# Patient Record
Sex: Female | Born: 1985 | Marital: Married | Smoking: Never smoker
Health system: Southern US, Community
[De-identification: ages and names within clinical notes are randomized; demographics above are authoritative.]

## PROBLEM LIST (undated history)

## (undated) DIAGNOSIS — T7840XA Allergy, unspecified, initial encounter: Secondary | ICD-10-CM

## (undated) HISTORY — DX: Allergy, unspecified, initial encounter: T78.40XA

---

## 2013-02-27 ENCOUNTER — Encounter: Payer: Self-pay | Admitting: Physician Assistant

## 2013-02-27 ENCOUNTER — Ambulatory Visit (INDEPENDENT_AMBULATORY_CARE_PROVIDER_SITE_OTHER): Payer: 59

## 2013-02-27 ENCOUNTER — Ambulatory Visit (INDEPENDENT_AMBULATORY_CARE_PROVIDER_SITE_OTHER): Payer: 59 | Admitting: Physician Assistant

## 2013-02-27 VITALS — BP 112/73 | HR 80 | Ht 65.0 in | Wt 159.0 lb

## 2013-02-27 DIAGNOSIS — Z111 Encounter for screening for respiratory tuberculosis: Secondary | ICD-10-CM

## 2013-02-27 DIAGNOSIS — Z0289 Encounter for other administrative examinations: Secondary | ICD-10-CM

## 2013-02-27 DIAGNOSIS — J069 Acute upper respiratory infection, unspecified: Secondary | ICD-10-CM

## 2013-02-27 DIAGNOSIS — Z0000 Encounter for general adult medical examination without abnormal findings: Secondary | ICD-10-CM

## 2013-02-27 MED ORDER — FLUTICASONE PROPIONATE 50 MCG/ACT NA SUSP: 2.0000 | Freq: Every day | NASAL | Status: AC

## 2013-02-27 NOTE — Patient Instructions (Addendum)

## 2013-02-27 NOTE — Progress Notes (Signed)
  Subjective:    Patient ID: Wendy Tran, female    DOB: August 14, 1985, 27 y.o.   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 Pt is also a little congested today with some PND. Started this morning. Not tried anything to make better. NO fever, chills, n/v/d, SOB, cough, wheezing.   Pt does not smoke or drink alcohol. Pt works out 3-5 days a week.   Pt has had fasting labs drawn and will be sending with records. She feels like she has all her immunizations for school. Will send over and fill out paperwork.     Review of Systems  All other systems reviewed and are negative.       Objective:   Physical Exam  Constitutional: She is oriented to person, place, and time. She appears well-developed and well-nourished.  HENT:  Head: Normocephalic and atraumatic.  TM's bilateral buldging with no erythema, blood or pus.  Negative for maxillary or frontal tenderness to palpation.  Turbinates red and swollen bilaterally.   Oropharynx red with PND. No enlarged tonsils or exudate.  Eyes: Conjunctivae are normal. Right eye exhibits no discharge. Left eye exhibits no discharge.  Neck: Normal range of motion. Neck supple.  Cardiovascular: Normal rate, regular rhythm and normal heart sounds.   Pulmonary/Chest: Effort normal and breath sounds normal.  Lymphadenopathy:    She has no cervical adenopathy.  Neurological: She is alert and oriented to person, place, and time.  Skin: Skin is warm and dry.  Psychiatric: She has a normal mood and affect. Her behavior is normal.          Assessment & Plan:  Routine screening- Filled out paperwork for school. Waiting on immunization report to finish paperwork. Pt declined Hep B vaccine. Pt gets flu shot free at work. Will get in October.   URI- discussed decongestant for a couple of days maybe with mucinex D. Vtiamin C and Zinc could also help.  Since nasal spray to help with nasal congestion and maybe help drain ears. Flonase sent to pharmacy.   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 No symptoms of TB will get Chest-Xray.

## 2013-04-27 ENCOUNTER — Other Ambulatory Visit: Payer: Self-pay

## 2013-09-01 ENCOUNTER — Ambulatory Visit (INDEPENDENT_AMBULATORY_CARE_PROVIDER_SITE_OTHER): Payer: 59 | Admitting: Psychiatry

## 2013-09-01 ENCOUNTER — Encounter (HOSPITAL_COMMUNITY): Payer: Self-pay | Admitting: Psychiatry

## 2013-09-01 DIAGNOSIS — F329 Major depressive disorder, single episode, unspecified: Secondary | ICD-10-CM

## 2013-09-01 DIAGNOSIS — F321 Major depressive disorder, single episode, moderate: Secondary | ICD-10-CM

## 2013-09-01 NOTE — Progress Notes (Signed)
  [facility] Biopsychosocial Assessment  Wendy BrilliantChristie Tran 27 y.o. 09/01/2013   Referred by: Self  PRESENTING PROBLEM Chief Complaint:  Pt is a 28 year old, separated, employed, Caucasian, female self-referred for counseling for the treatment of depression following the recent separation from her husband. Pt states her primary stressor is 1) Husband. Pt said she and her husband were living in [place] and decided to relocate to [place] last year. Pt said they got married after relocating. Pt said they bought a house together in [place] and got two puppies together and then her husband decided he no longer wanted to be married and moved back up to [place] and is living with his parents. Pt said she feels all alone in a new state without any support, is trying to figure out of to sell their home that her and her husband own jointly and trying to adjust to the loss of her husband and their marriage. Pt was tearful as she recalled the past years events leading up to the separation. Pt said she was blindsided by him leaving, but said he has a history of impulsive behavior and he has separated from her before they got married for a short period of time. Pt said her mother came down from [place] and has been staying with Pt after the separation. Pt describes her as a support. Pt is scheduled to move back to [place] April 5th. Pt denies any other past mental health history or current stressors. Pt said she wants counseling to help her with the adjustment from the separation from her husband.   What are the main stressors in your life right now, how long?  Depression and Seperation from husband.  PREVIOUS MENTAL  HEALTH SERVICES Pt Denies  MEDICAL HISTORY Pt Denies  SOCIAL/FAMILY HISTORY Pt currently lives alone in [place].  EDUCATION College Degree   EMPLOYMENT(financial issues) Pt works full time on third shift at [employer] [place]  LEGAL HISTORY Pt Denies  SUBSTANCE USE Pt Denies  MENTAL STATUS General Appearance Luretha Murphy/Behavior:  Neat, well groomed Eye Contact: Good Motor Behavior: Good Speech: Normal rate, volume, tone Level of Consciousness:  WNL Mood: Depressed Affect: wide range of affect Anxiety Level: minimal Thought Process: Clear, coherent, goal directed  Thought Content: WNL Perception: Good Judgment: Good Insight: Good Cognition: WNL  DIAGNOSIS  AXIS I   Depression Major Single Episode Moderate   PLAN Oriented Pt to the therapeutic process and discussed therapeutic expectations. Provided Pt with crisis information including; procedures to manage a mental health emergency, daytime crisis phone numbers and after hour's crisis contact information.  Pt agrees to weekly counseling to address symptoms of depression from the separation from her husband.    Carman ChingGLEHORN,[place] E, LCSW 09/01/2013

## 2014-02-23 IMAGING — CR DG CHEST 2V
2 series · 2 of 2 positions shown · non-contrast
Comparison: None.

CLINICAL DATA: Annual physical exam.  Chronically positive PPD
test.

CHEST - 2 VIEW

[view not recorded (1 of 2)]
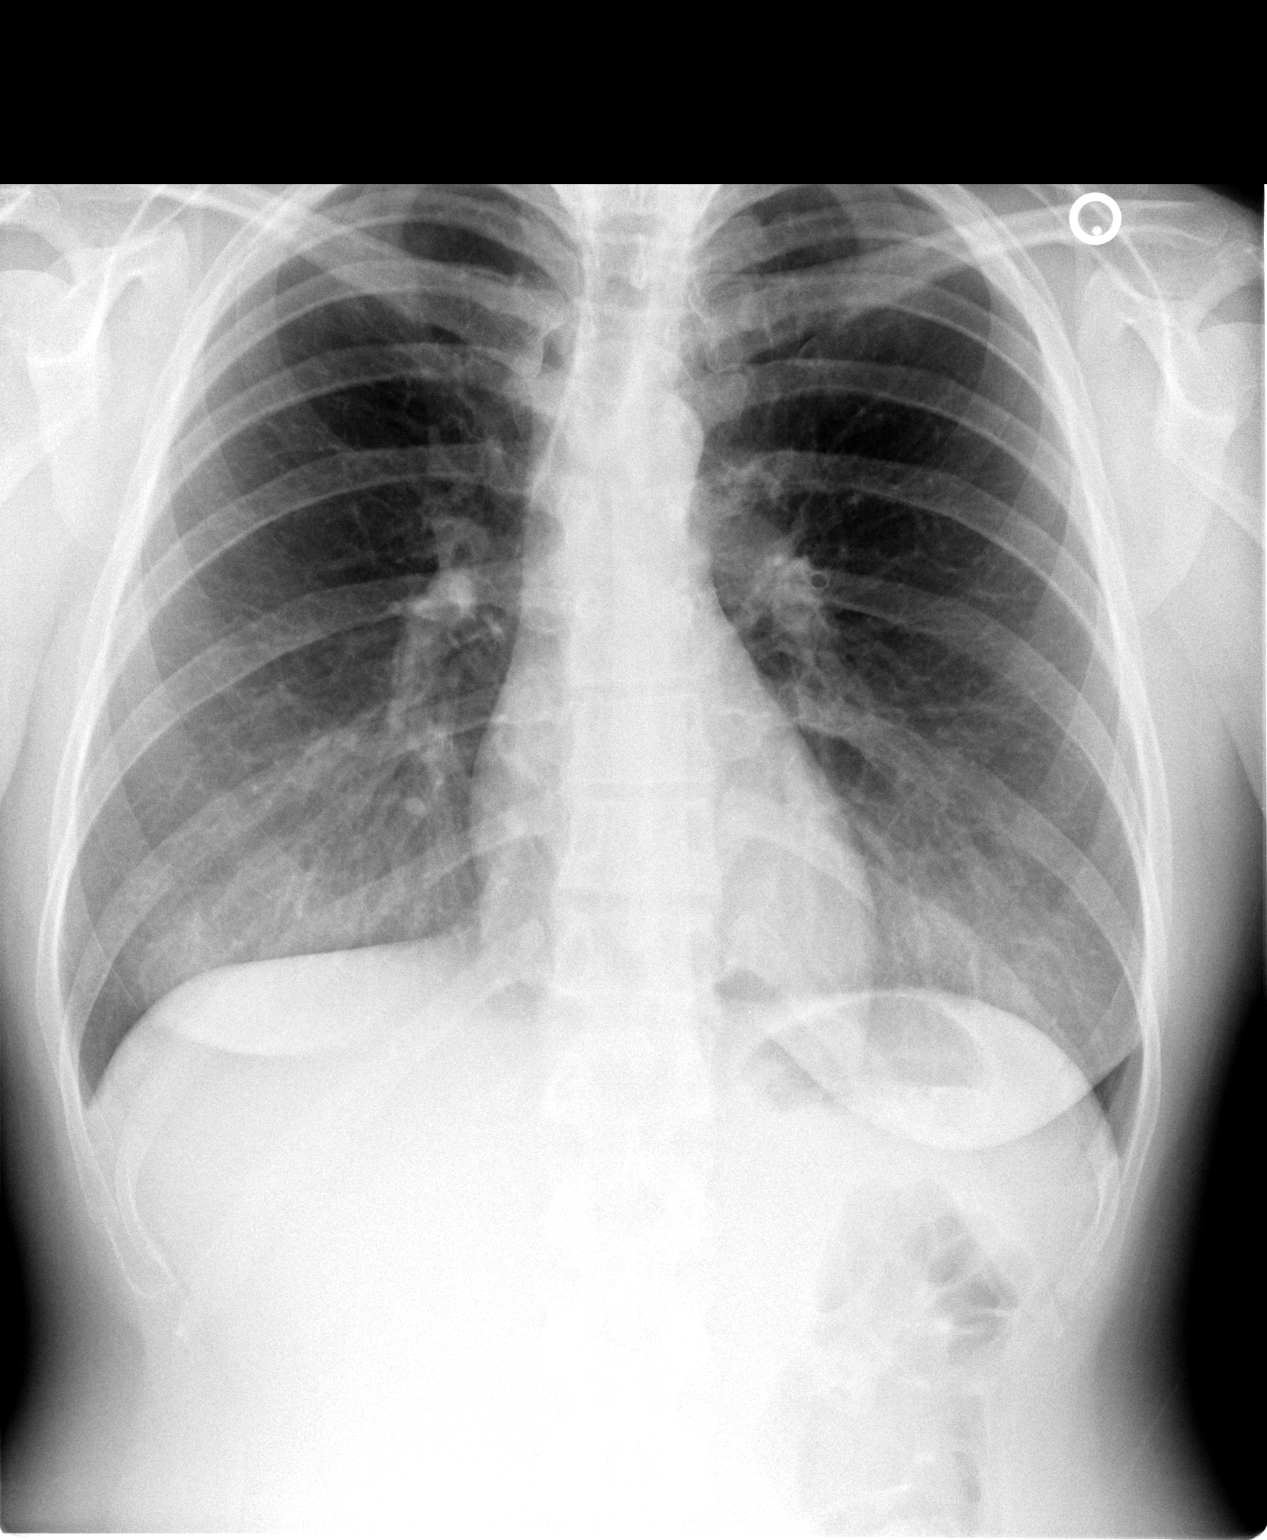

[view not recorded (2 of 2)]
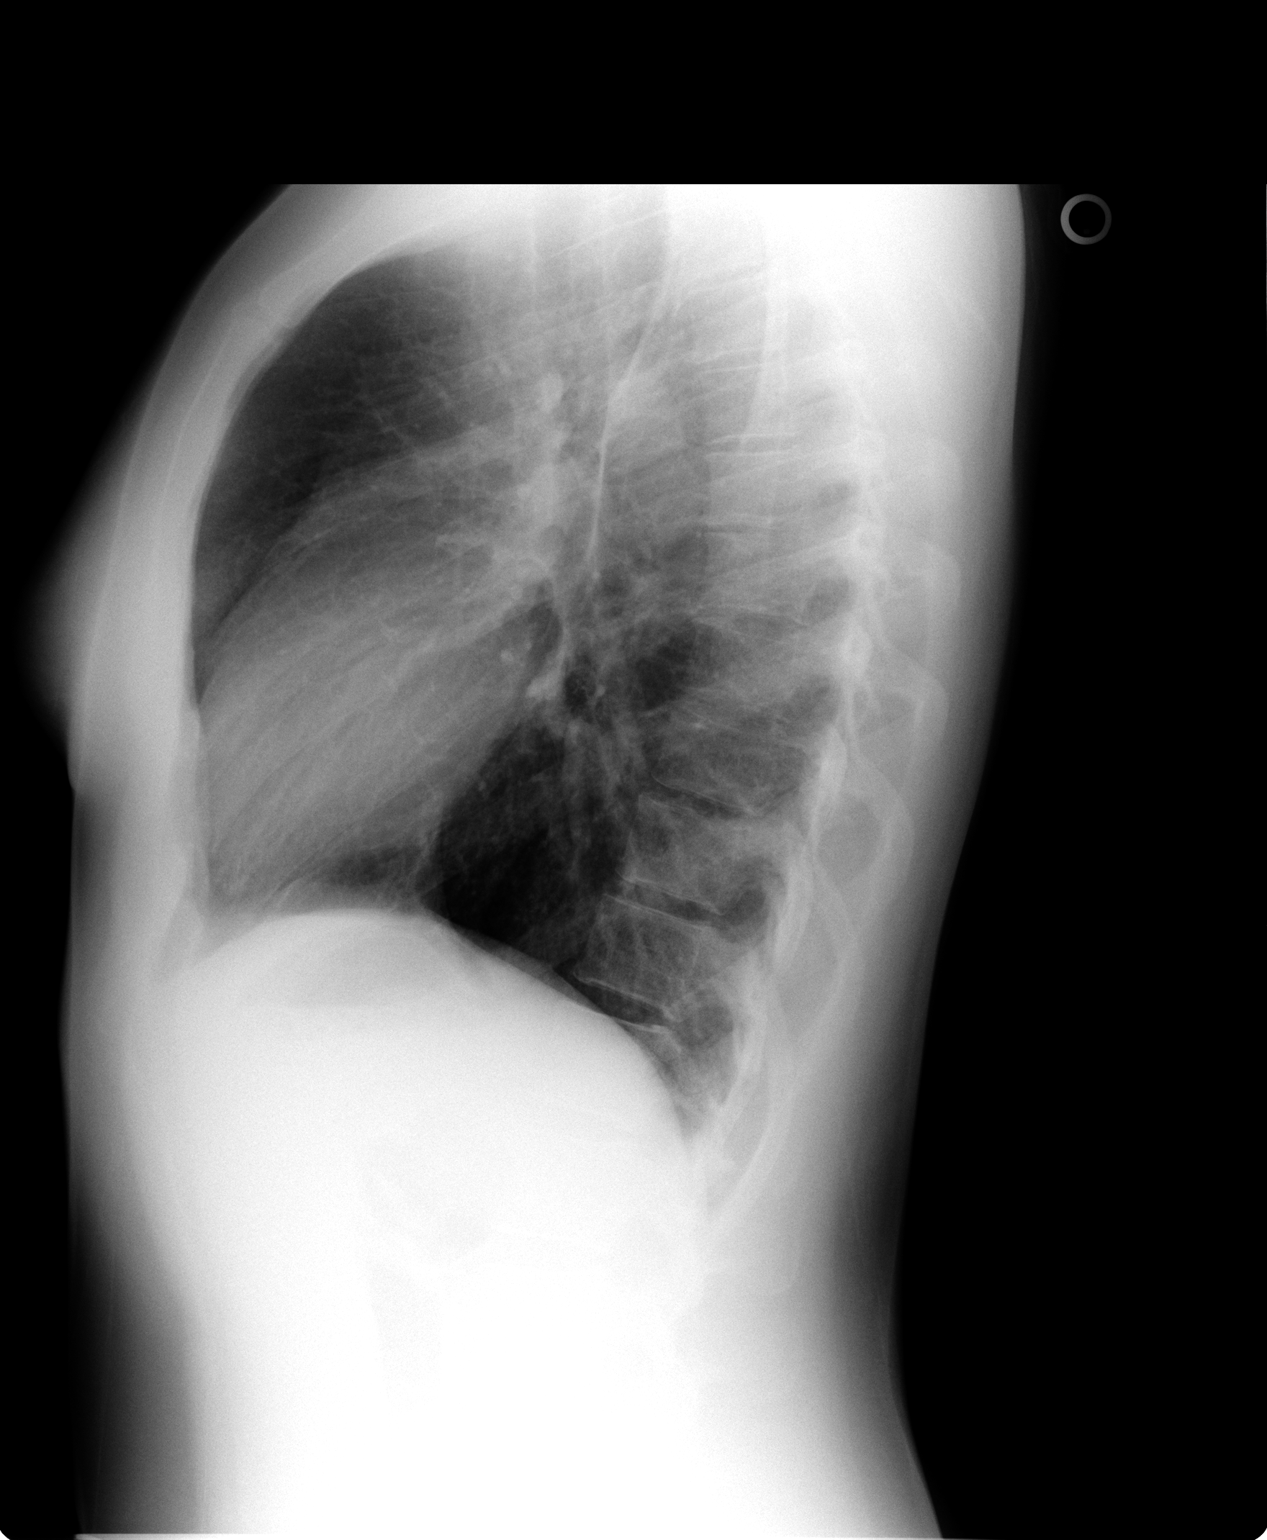

[2 of 2 positions shown; findings below may reference images not displayed]

FINDINGS: Trachea is midline.  Heart size normal.  Lungs are clear.
No pleural fluid.
IMPRESSION: No acute findings.
